# Patient Record
Sex: Female | Born: 1940 | Race: White | Hispanic: No | Marital: Married | Smoking: Never smoker
Health system: Southern US, Community
[De-identification: ages and names within clinical notes are randomized; demographics above are authoritative.]

## PROBLEM LIST (undated history)

## (undated) DIAGNOSIS — C50919 Malignant neoplasm of unspecified site of unspecified female breast: Secondary | ICD-10-CM

---

## 2017-01-26 ENCOUNTER — Inpatient Hospital Stay (HOSPITAL_COMMUNITY)
Admission: EM | Admit: 2017-01-26 | Discharge: 2017-02-24 | DRG: 082 | Disposition: E | Payer: Medicare Other | Attending: Internal Medicine | Admitting: Internal Medicine

## 2017-01-26 ENCOUNTER — Emergency Department (HOSPITAL_COMMUNITY): Payer: Medicare Other

## 2017-01-26 ENCOUNTER — Encounter (HOSPITAL_COMMUNITY): Payer: Self-pay | Admitting: Emergency Medicine

## 2017-01-26 DIAGNOSIS — Y92531 Health care provider office as the place of occurrence of the external cause: Secondary | ICD-10-CM | POA: Diagnosis not present

## 2017-01-26 DIAGNOSIS — J9601 Acute respiratory failure with hypoxia: Secondary | ICD-10-CM | POA: Diagnosis present

## 2017-01-26 DIAGNOSIS — R402113 Coma scale, eyes open, never, at hospital admission: Secondary | ICD-10-CM | POA: Diagnosis present

## 2017-01-26 DIAGNOSIS — W01198A Fall on same level from slipping, tripping and stumbling with subsequent striking against other object, initial encounter: Secondary | ICD-10-CM | POA: Diagnosis present

## 2017-01-26 DIAGNOSIS — Z9221 Personal history of antineoplastic chemotherapy: Secondary | ICD-10-CM | POA: Diagnosis not present

## 2017-01-26 DIAGNOSIS — R402313 Coma scale, best motor response, none, at hospital admission: Secondary | ICD-10-CM | POA: Diagnosis present

## 2017-01-26 DIAGNOSIS — S065XAA Traumatic subdural hemorrhage with loss of consciousness status unknown, initial encounter: Secondary | ICD-10-CM | POA: Diagnosis present

## 2017-01-26 DIAGNOSIS — R402213 Coma scale, best verbal response, none, at hospital admission: Secondary | ICD-10-CM | POA: Diagnosis present

## 2017-01-26 DIAGNOSIS — W19XXXA Unspecified fall, initial encounter: Secondary | ICD-10-CM | POA: Diagnosis present

## 2017-01-26 DIAGNOSIS — C50919 Malignant neoplasm of unspecified site of unspecified female breast: Secondary | ICD-10-CM | POA: Diagnosis present

## 2017-01-26 DIAGNOSIS — I6200 Nontraumatic subdural hemorrhage, unspecified: Secondary | ICD-10-CM

## 2017-01-26 DIAGNOSIS — Z515 Encounter for palliative care: Secondary | ICD-10-CM | POA: Diagnosis present

## 2017-01-26 DIAGNOSIS — Z66 Do not resuscitate: Secondary | ICD-10-CM | POA: Diagnosis present

## 2017-01-26 DIAGNOSIS — G935 Compression of brain: Secondary | ICD-10-CM | POA: Diagnosis present

## 2017-01-26 DIAGNOSIS — S065X9A Traumatic subdural hemorrhage with loss of consciousness of unspecified duration, initial encounter: Secondary | ICD-10-CM

## 2017-01-26 HISTORY — DX: Malignant neoplasm of unspecified site of unspecified female breast: C50.919

## 2017-01-26 MED ORDER — GLYCOPYRROLATE 0.2 MG/ML IJ SOLN: 0.2000 mg | INTRAMUSCULAR | Status: DC | PRN

## 2017-01-26 MED ORDER — GLYCOPYRROLATE 1 MG PO TABS
1.0000 mg | ORAL_TABLET | ORAL | Status: DC | PRN
  Filled 2017-01-26: qty 1

## 2017-01-26 MED ORDER — MORPHINE SULFATE (PF) 4 MG/ML IV SOLN: 1.0000 mg | INTRAVENOUS | Status: DC | PRN

## 2017-01-26 MED ORDER — LORAZEPAM 2 MG/ML IJ SOLN: 1.0000 mg | INTRAMUSCULAR | Status: DC | PRN

## 2017-01-26 MED ORDER — LORAZEPAM 1 MG PO TABS: 1.0000 mg | ORAL_TABLET | ORAL | Status: DC | PRN

## 2017-01-26 MED ORDER — ONDANSETRON HCL 4 MG/2ML IJ SOLN: 4.0000 mg | Freq: Four times a day (QID) | INTRAMUSCULAR | Status: DC | PRN

## 2017-01-26 MED ORDER — GLYCOPYRROLATE 0.2 MG/ML IJ SOLN
0.2000 mg | INTRAMUSCULAR | Status: DC | PRN
  Administered 2017-01-26: 0.2 mg via INTRAVENOUS
  Filled 2017-01-26 (×2): qty 1

## 2017-01-26 MED ORDER — ONDANSETRON 4 MG PO TBDP: 4.0000 mg | ORAL_TABLET | Freq: Four times a day (QID) | ORAL | Status: DC | PRN

## 2017-01-26 MED ORDER — LORAZEPAM 2 MG/ML IJ SOLN
1.0000 mg | INTRAMUSCULAR | Status: DC | PRN
Start: 2017-01-26 — End: 2017-01-27

## 2017-01-26 MED ORDER — PHENYLEPHRINE 40 MCG/ML (10ML) SYRINGE FOR IV PUSH (FOR BLOOD PRESSURE SUPPORT)
PREFILLED_SYRINGE | INTRAVENOUS | Status: AC
  Filled 2017-01-26: qty 10

## 2017-01-26 MED ORDER — LORAZEPAM 2 MG/ML PO CONC: 1.0000 mg | ORAL | Status: DC | PRN

## 2017-01-26 NOTE — ED Notes (Signed)
Pt transported to CT on cardiac monitor with RN

## 2017-01-26 NOTE — Progress Notes (Signed)
Patient admitted  Through the ED into 5M17. On arrival patient non verbal and had in situ non rebreather mask with o2 sats 100%. Patient DNR. Made comfortable in bed. Oriented and reassure  family member. Will keep monitoring.

## 2017-01-26 NOTE — ED Triage Notes (Signed)
Pt had syncopal episode while at home with husband.  He caught her and lowered her to ground.  On EMS arrival, pt unresponsive, cool, clammy, shallow respirations 12-14.  Pupils initially constricted at a 2 per EMS and upon arrival L pupil 3, and R pupil dilated.  PT remains unresponsive.  Pt being bagged on arrival.  EMS unsure of code status.  Reports Stage 4 breast CA.

## 2017-01-26 NOTE — ED Notes (Signed)
Delay in lab draw, edp just entered room and wants to talk to family in private.

## 2017-01-26 NOTE — ED Notes (Signed)
EDP and RT at bedside preparing to intubate.

## 2017-01-26 NOTE — ED Notes (Signed)
Per husband- pt does not wish to be resuscitated.  Husband to bedside to speak with Dr. Oleta Mouse.

## 2017-01-26 NOTE — H&P (Addendum)
History and Physical    Barbara Sherman XHB:[id number] DOB: 07-19-1940 DOA: 02/17/2017  Referring MD/NP/PA: Dr. Salvadore Dom PCP: No primary care provider on file.  Patient coming from: home via EMS  Chief Complaint: Unresponsive  HPI: Barbara Sherman is a 76 y.o. female with medical history significant of metastatic breast cancer; who presents after becoming unresponsive. History is obtained from the patient's husband who is present at bedside as the patient unable to give her own history at this time. Patient had been undergoing chemotherapy treatment for her cancer, but had become very weak lately. She had a follow-up appointment at term on helping [place] today at around 2 PM, and during the appointment lost her balance and fell hitting her head on the corner on an object in the room. There was reported to be no loss of consciousness and they observed her in the office for at least 45 minutes prior to letting her husband take her home. Once home the patient had gone to use the restroom. After some time the patient's husband called in to check on her and she said that she didn't feel well. As her husband tried to assist her from the bathroom she went unresponsive. He was able to catch her and lowered to the ground, but she was cool and clammy with shallow respirations. He immediately called EMS.  ED Course: Upon arrival to the emergency department patient was seen to have a temperature of 94.65F, pulse 31-127, respirations 19-32, blood pressure is maintained, O2 saturations as low as 72% on RA and patient was placed on a nonrebreather with improvement of O2 saturations. CT imaging revealed a large acute subdural hematoma. The patient's husband was updated on the imaging findings and requested comfort care measures only.  Review of Systems: Review of Systems  Unable to perform ROS: Patient unresponsive    Past Medical History:  Diagnosis Date  . Breast cancer ([place])     History reviewed. No  pertinent surgical history.   reports that she has never smoked. She has never used smokeless tobacco. She reports that she drinks alcohol. She reports that she does not use drugs.  Allergies:Tetracycline and other related products cause liver swelling  No known family history of any known problems of inpatient small  Prior to Admission medications   No known     Physical Exam:  Constitutional: Elderly female unresponsive with agonal breathing Vitals:   02/23/2017 2014 02/01/2017 2028 02/19/2017 2030 02/09/2017 2045  BP:  (!) 134/102 (!) 139/101 (!) 149/94  Pulse: (!) 127 (!) 31 (!) 122 (!) 119  Resp: (!) 29 (!) 32 (!) 29 (!) 30  Temp:      TempSrc:      SpO2: 98% 96% 98% 100%  Weight:       Eyes: Right pupil dilated, lids and conjunctivae normal ENMT: Mucous membranes are moist. Posterior pharynx clear of any exudate or lesions. Neck: normal, supple, no masses, no thyromegaly Respiratory: Tachypneic with very shallow respirations  Cardiovascular: Tachycardic no significant edema appreciated Abdomen: no tenderness, no masses palpated. No hepatosplenomegaly. Bowel sounds positive.  Musculoskeletal: no clubbing / cyanosis. No joint deformity upper and lower extremities. Good ROM, no contractures. Normal muscle tone.  Skin: Cool and clammy. no rashes, lesions, ulcers. No induration Neurologic: Unresponsive. Psychiatric: obtunded, and cannot assess    Labs on Admission: I have personally reviewed following labs and imaging studies  CBC: No results for input(s): WBC, NEUTROABS, HGB, HCT, MCV, PLT in the last 168 hours. Basic Metabolic  Panel: No results for input(s): NA, K, CL, CO2, GLUCOSE, BUN, CREATININE, CALCIUM, MG, PHOS in the last 168 hours. GFR: CrCl cannot be calculated (No order found.). Liver Function Tests: No results for input(s): AST, ALT, ALKPHOS, BILITOT, PROT, ALBUMIN in the last 168 hours. No results for input(s): LIPASE, AMYLASE in the last 168 hours. No results  for input(s): AMMONIA in the last 168 hours. Coagulation Profile: No results for input(s): INR, PROTIME in the last 168 hours. Cardiac Enzymes: No results for input(s): CKTOTAL, CKMB, CKMBINDEX, TROPONINI in the last 168 hours. BNP (last 3 results) No results for input(s): PROBNP in the last 8760 hours. HbA1C: No results for input(s): HGBA1C in the last 72 hours. CBG: No results for input(s): GLUCAP in the last 168 hours. Lipid Profile: No results for input(s): CHOL, HDL, LDLCALC, TRIG, CHOLHDL, LDLDIRECT in the last 72 hours. Thyroid Function Tests: No results for input(s): TSH, T4TOTAL, FREET4, T3FREE, THYROIDAB in the last 72 hours. Anemia Panel: No results for input(s): VITAMINB12, FOLATE, FERRITIN, TIBC, IRON, RETICCTPCT in the last 72 hours. Urine analysis: No results found for: COLORURINE, APPEARANCEUR, LABSPEC, PHURINE, GLUCOSEU, HGBUR, BILIRUBINUR, KETONESUR, PROTEINUR, UROBILINOGEN, NITRITE, LEUKOCYTESUR Sepsis Labs: No results found for this or any previous visit (from the past 240 hour(s)).   Radiological Exams on Admission: Ct Head Wo Contrast  Result Date: 01/30/2017 CLINICAL DATA:  76 y/o  F; syncopal episode. EXAM: CT HEAD WITHOUT CONTRAST TECHNIQUE: Contiguous axial images were obtained from the base of the skull through the vertex without intravenous contrast. COMPARISON:  None. FINDINGS: Brain: Acute subdural hematoma over the right cerebral convexity measuring up to 21 mm in thickness and left parietal convexity mixed attenuation subdural hematoma measuring up to 14 mm in thickness. Severe mass effect on the underlying brain with diffuse sulcal effacement, right lateral ventricle effacement common right to left midline shift of 13 mm, and effacement of the basilar cisterns with right-sided uncal herniation. No brain parenchymal hemorrhage or large stroke is identified at this time. Vascular: Calcific atherosclerosis of carotid siphons. Skull: Normal. Negative for fracture  or focal lesion. Sinuses/Orbits: No acute finding. Other: None. IMPRESSION: 1. Large acute subdural hematoma over the right cerebral convexity and additional smaller subdural hematoma over the left parietal lobe. 2. Severe mass effect on the underlying brain with effacement of right lateral and third ventricles, 13 mm right to left midline shift, right-sided uncal herniation, and near complete effacement of basilar cisterns. 3. No brain parenchymal hemorrhage or acute infarct identified at this time. Critical Value/emergent results were called by telephone at the time of interpretation on 02/03/2017 at 8:29 pm to Dr. Brantley Stage , who verbally acknowledged these results. Electronically Signed   By: Kristine Garbe M.D.   On: 02/02/2017 20:32   Dg Chest Portable 1 View  Result Date: 01/31/2017 CLINICAL DATA:  Unresponsive, syncopal episode. EXAM: PORTABLE CHEST 1 VIEW COMPARISON:  None. FINDINGS: Mild cardiomegaly. Atherosclerotic changes noted at the aortic arch. Coarse interstitial lung markings bilaterally without evidence of pneumonia or pulmonary edema. No pleural effusion or pneumothorax seen. Right chest wall Port-A-Cath in place with tip well-positioned at the level of the lower SVC/ cavoatrial junction. Sclerotic changes are seen throughout the visualized osseous structures suggesting osseous metastases. IMPRESSION: 1. No active cardiopulmonary abnormality seen. No evidence of pneumonia or pulmonary edema. 2. Osseous structures diffusely sclerotic suggesting osseous metastases. 3. Cardiomegaly. 4. Aortic atherosclerosis. Electronically Signed   By: Franki Cabot M.D.   On: 02/21/2017 20:30    EKG:  Independently reviewed. Sinus tachycardia with a BB  Assessment/Plan Comfort care measures only Subdural hematoma s/p Fall Uncal herniation Metastatic breast cancer  Given patient's poor prognostic outlook with known metastatic breast cancer and DO NOT RESUSCITATE status recommended comfort care  measures only due to significant subdural bleeding found on CT causing uncal herniation on admission likely related to recent fall. - Admit to a MedSurg bed - Apply Continuous O2 - Ativan as needed for seizure-like activity/anxiety - Morphine as needed for pain or dyspnea - Glycopyrrolate as needed for secretions - Zofran as needed for nausea or vomiting  DVT prophylaxis: none Code Status: DNR  Family Communication: Discussed all with care with the patient's husband who is at bedside. Disposition Plan: N/A Consults called: None Admission status: Inpatient   Norval Morton MD [facility] Pager [phone]   If 7PM-7AM, please contact night-coverage [email or web address] Password [email or web address]  02/02/2017, 9:21 PM

## 2017-01-26 NOTE — ED Notes (Addendum)
Report given to Lydia Guiles, Therapist, sports. Pt is COMFORT CARE.

## 2017-01-26 NOTE — ED Provider Notes (Addendum)
[facility] DEPT Provider Note   CSN: [id number] Arrival date & time: 01/24/2017  1943     History   Chief Complaint Chief Complaint  Patient presents with  . unresponsive    HPI Barbara Sherman is a 76 y.o. female.  HPI Level 5 caveat due to unresponsiveness.  76 year old female with history of metastatic breast cancer who presents with episode of unresponsiveness. According to EMS around 1900 patient had a syncopal episode and since then has been unresponsive. She is agonally breathing.   Past Medical History:  Diagnosis Date  . Breast cancer ([place])     There are no active problems to display for this patient.   History reviewed. No pertinent surgical history.  OB History    No data available       Home Medications    Prior to Admission medications   Not on File    Family History No family history on file.  Social History Social History  Substance Use Topics  . Smoking status: Never Smoker  . Smokeless tobacco: Never Used  . Alcohol use Yes     Allergies   Patient has no allergy information on record.   Review of Systems Review of Systems  Unable to perform ROS: Patient unresponsive     Physical Exam Updated Vital Signs BP (!) 139/101   Pulse (!) 122   Resp (!) 29   Wt 77.1 kg (170 lb)   SpO2 98%   Physical Exam  Constitutional: She appears distressed.  Acutely ill-appearing,  HENT:  Head: Normocephalic.  Eyes:  Right pupil dilated to 5 mm, left pupil 3 mm  Neck: Neck supple.  Cardiovascular: Regular rhythm.   Heart tachycardic rate  Pulmonary/Chest: She is in respiratory distress.  Abdominal: Soft. She exhibits no distension.  Musculoskeletal: She exhibits no deformity.  Neurological:  Unresponsive to voice or tactile stimuli  Skin:  Cool to touch. Dry     ED Treatments / Results  Labs (all labs ordered are listed, but only abnormal results are displayed) Labs Reviewed - No data to display  EKG  EKG  Interpretation None       Radiology Ct Head Wo Contrast  Result Date: 01/31/2017 CLINICAL DATA:  76 y/o  F; syncopal episode. EXAM: CT HEAD WITHOUT CONTRAST TECHNIQUE: Contiguous axial images were obtained from the base of the skull through the vertex without intravenous contrast. COMPARISON:  None. FINDINGS: Brain: Acute subdural hematoma over the right cerebral convexity measuring up to 21 mm in thickness and left parietal convexity mixed attenuation subdural hematoma measuring up to 14 mm in thickness. Severe mass effect on the underlying brain with diffuse sulcal effacement, right lateral ventricle effacement common right to left midline shift of 13 mm, and effacement of the basilar cisterns with right-sided uncal herniation. No brain parenchymal hemorrhage or large stroke is identified at this time. Vascular: Calcific atherosclerosis of carotid siphons. Skull: Normal. Negative for fracture or focal lesion. Sinuses/Orbits: No acute finding. Other: None. IMPRESSION: 1. Large acute subdural hematoma over the right cerebral convexity and additional smaller subdural hematoma over the left parietal lobe. 2. Severe mass effect on the underlying brain with effacement of right lateral and third ventricles, 13 mm right to left midline shift, right-sided uncal herniation, and near complete effacement of basilar cisterns. 3. No brain parenchymal hemorrhage or acute infarct identified at this time. Critical Value/emergent results were called by telephone at the time of interpretation on 02/07/2017 at 8:29 pm to Dr. Brantley Stage , who  verbally acknowledged these results. Electronically Signed   By: Kristine Garbe M.D.   On: 02/12/2017 20:32   Dg Chest Portable 1 View  Result Date: 02/05/2017 CLINICAL DATA:  Unresponsive, syncopal episode. EXAM: PORTABLE CHEST 1 VIEW COMPARISON:  None. FINDINGS: Mild cardiomegaly. Atherosclerotic changes noted at the aortic arch. Coarse interstitial lung markings bilaterally  without evidence of pneumonia or pulmonary edema. No pleural effusion or pneumothorax seen. Right chest wall Port-A-Cath in place with tip well-positioned at the level of the lower SVC/ cavoatrial junction. Sclerotic changes are seen throughout the visualized osseous structures suggesting osseous metastases. IMPRESSION: 1. No active cardiopulmonary abnormality seen. No evidence of pneumonia or pulmonary edema. 2. Osseous structures diffusely sclerotic suggesting osseous metastases. 3. Cardiomegaly. 4. Aortic atherosclerosis. Electronically Signed   By: Franki Cabot M.D.   On: 02/06/2017 20:30    Procedures Procedures (including critical care time) CRITICAL CARE Performed by: Forde Dandy   Total critical care time: 35 minutes  Critical care time was exclusive of separately billable procedures and treating other patients.  Critical care was necessary to treat or prevent imminent or life-threatening deterioration.  Critical care was time spent personally by me on the following activities: development of treatment plan with patient and/or surrogate as well as nursing, evaluation of patient's response to treatment, examination of patient, obtaining history from patient or surrogate, ordering and performing treatments and interventions, ordering and review of laboratory studies, ordering and review of radiographic studies, pulse oximetry and re-evaluation of patient's condition.  Medications Ordered in ED Medications - No data to display   Initial Impression / Assessment and Plan / ED Course  I have reviewed the triage vital signs and the nursing notes.  Pertinent labs & imaging results that were available during my care of the patient were reviewed by me and considered in my medical decision making (see chart for details).     Patient arrives unresponsive, agonal breath sounds, hypoxic with nonrebreather. BVM assisted breaths given at bedside. Plan for intubation, however patient's husband  arrived. I spoke with him and he does confirm that she is DO NOT RESUSCITATE. Concern for intracranial process given that she had possible posturing and her unresponsiveness with unequal pupils. CT head does confirm bilateral subdural hematomas worse on the right. She also has evidence of early uncal herniation and mass effect. I spoke with patient's husband. Given her advanced age, advanced breast cancer, and poor prognosis he does wish to make her comfort care. Plan to admit for palliative care consult and comfort care.   Final Clinical Impressions(s) / ED Diagnoses   Final diagnoses:  Subdural hematoma ([place])  Uncal herniation [facility])    New Prescriptions New Prescriptions   No medications on file     Forde Dandy, MD 02/19/2017 2045    Forde Dandy, MD 02/22/17 [phone]

## 2017-02-24 NOTE — Progress Notes (Addendum)
Patient was seen to have  low pulse and 02 sats by the nurse tech during vital signs checking at 0540. RN was called in to see patient. On arrival patient was seen to giving the last breathe. RN checked the pulse and saw there was no pulse and patient has ceased breathing at exactly 0545. The charged nurse was called to confirm to pronounce her dead. Attending MD was notified. Significant other at the bedside. Reassured and consoled.  Required forms have been filled and all appropriate calls have also been made. Patients's body prepared and sent down to the morgue.

## 2017-02-24 NOTE — Death Summary Note (Signed)
Barbara Sherman OZD:[id number] DOB: Nov 20, 1940 DOA: February 20, 2017  PCP: Verdell Carmine, MD PCP/Office notified: Will be notified by family  Admit date: February 20, 2017 Date of Death: 02/21/2017 at 5:45 AM  Final Diagnoses:  Active Problems:   Subdural hematoma ([facility])   Comfort measures only status   Uncal herniation   Fall      History of present illness:  Barbara Sherman is a 76 y.o. female with medical history significant of metastatic breast cancer on chemotherapy; who presents after becoming unresponsive. History is obtained from the patient's husband who is present at bedside as the patient unable to give her own history at this time. Patient had been undergoing chemotherapy treatment for her cancer, but had become very weak lately. She had a follow-up appointment at term on helping [place] today at around 2 PM, and during the appointment lost her balance and fell hitting her head on the corner on an object in the room. There was reported to be no loss of consciousness and they observed her in the office for at least 45 minutes prior to letting her husband take her home. Once home the patient had gone to use the restroom. After some time the patient's husband called in to check on her and she said that she didn't feel well. As her husband tried to assist her from the bathroom she went unresponsive. He was able to catch her and lowered to the ground, but she was cool and clammy with shallow respirations. He immediately called EMS.  Hospital Course:  Upon arrival to the emergency department patient was seen to have a temperature of 94.62F, pulse 31-127, respirations 19-32, blood pressure is maintained, O2 saturations as low as 72% on RA and patient was placed on a nonrebreather with improvement of O2 saturations. CT imaging revealed a large acute subdural hematoma with signs of focal herniation. The patient's husband was updated on the imaging findings and requested comfort care measures only. Patient was  moved to a MedSurg bed. RN pronounced time of death at 5:45 AM on February 21, 2017.     Signed:  Norval Morton  [facility] 2017/02/21, 6:50 AM

## 2017-02-24 DEATH — deceased

## 2018-10-06 IMAGING — DX DG CHEST 1V PORT
1 series · 1 of 1 positions shown · non-contrast
Comparison: None.

CLINICAL DATA: Unresponsive, syncopal episode.

EXAM:
PORTABLE CHEST 1 VIEW

[chest]
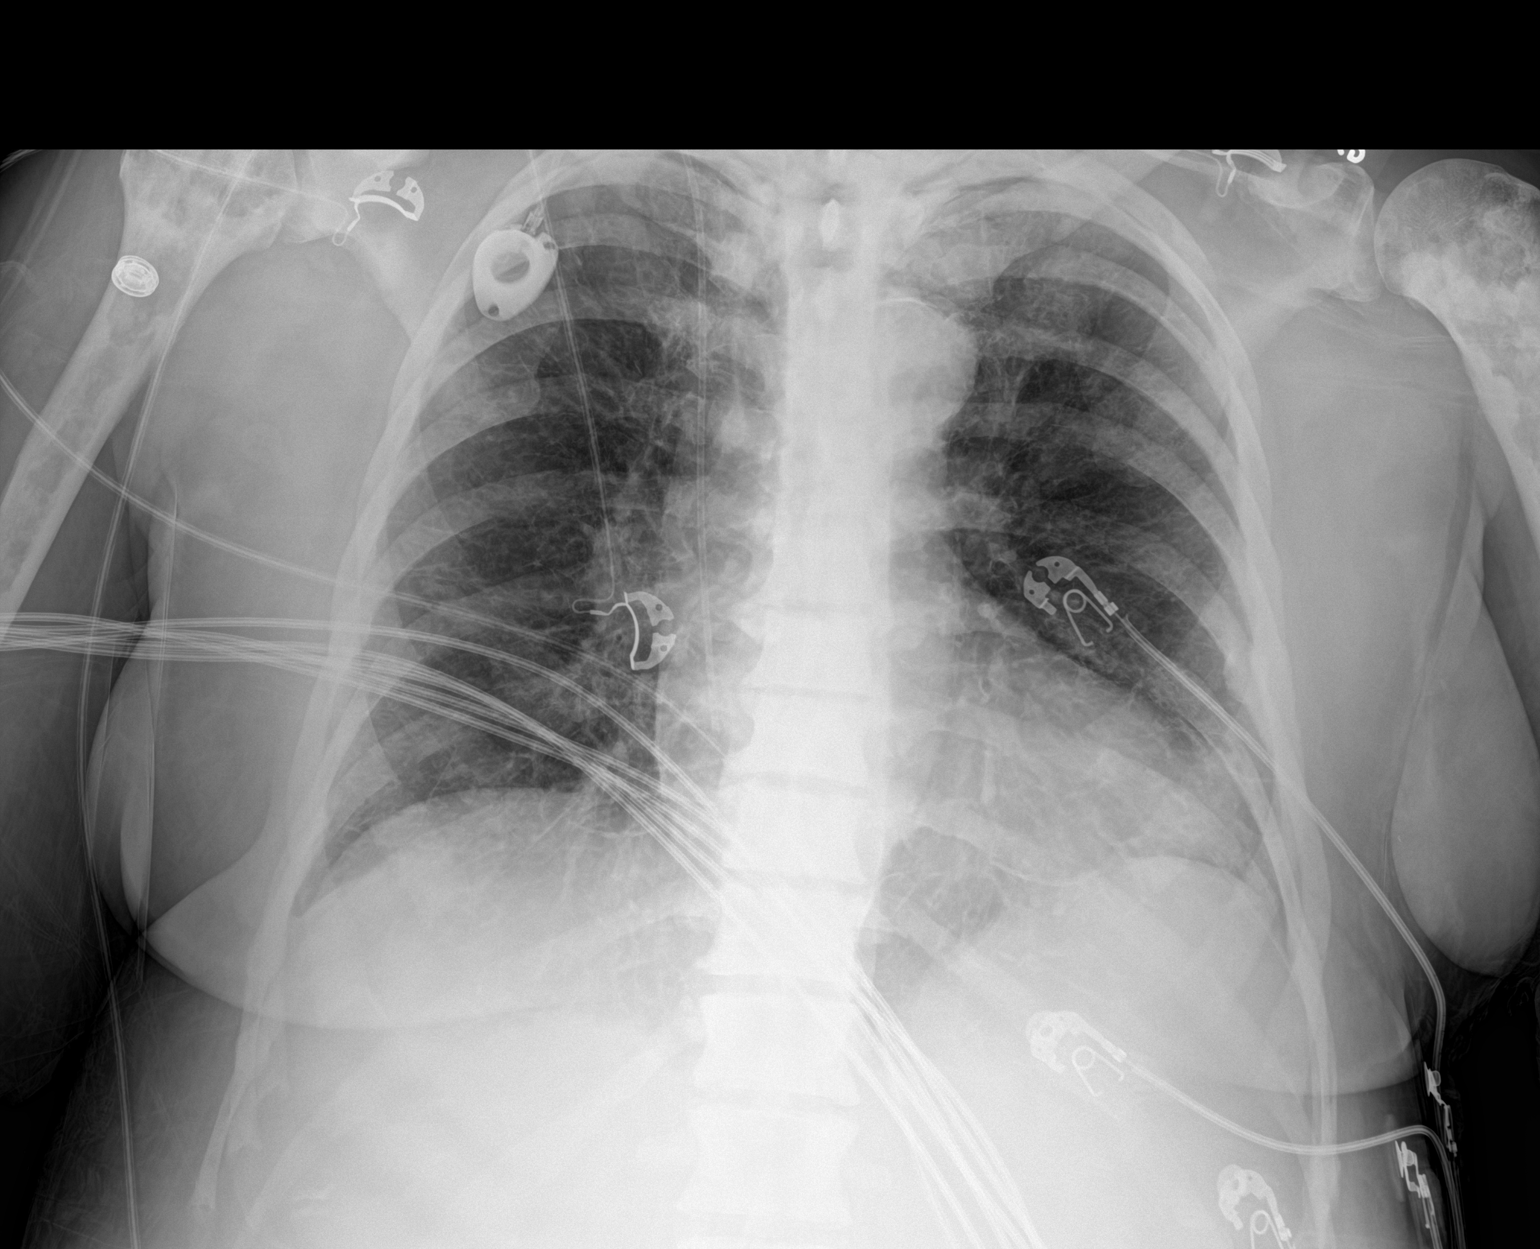

[1 of 1 positions shown; findings below may reference images not displayed]

FINDINGS: Mild cardiomegaly. Atherosclerotic changes noted at the aortic arch.
Coarse interstitial lung markings bilaterally without evidence of
pneumonia or pulmonary edema. No pleural effusion or pneumothorax
seen. Right chest wall Port-A-Cath in place with tip well-positioned
at the level of the lower SVC/ cavoatrial junction.

Sclerotic changes are seen throughout the visualized osseous
structures suggesting osseous metastases.
IMPRESSION: 1. No active cardiopulmonary abnormality seen. No evidence of
pneumonia or pulmonary edema.
2. Osseous structures diffusely sclerotic suggesting osseous
metastases.
3. Cardiomegaly.
4. Aortic atherosclerosis.

## 2018-10-06 IMAGING — CT CT HEAD W/O CM
4 series · 15 of 47 positions shown, 17 images · non-contrast
Comparison: None.

CLINICAL DATA: 75 y/o  F; syncopal episode.

EXAM:
CT HEAD WITHOUT CONTRAST
TECHNIQUE: Contiguous axial images were obtained from the base of the skull
through the vertex without intravenous contrast.

[Series 3: head without · axial · non-contrast · 0.49mm/px · z∈[-178,-58]mm · 7 of 34 slices shown, 9 images]
[im 5/34  brain]
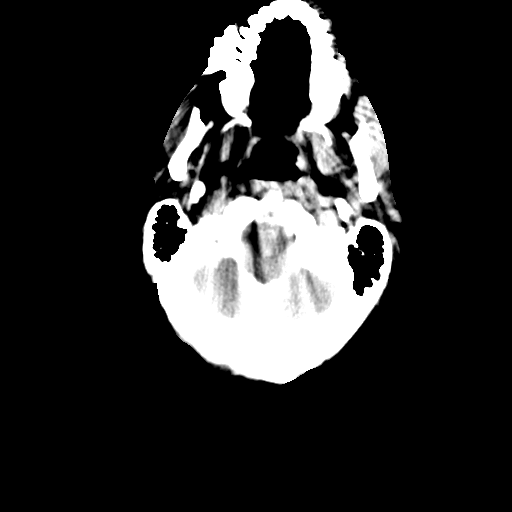
[im 5/34  bone]
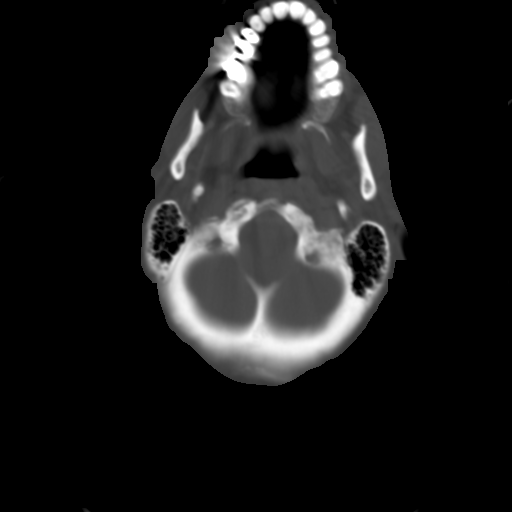
[im 9/34  brain]
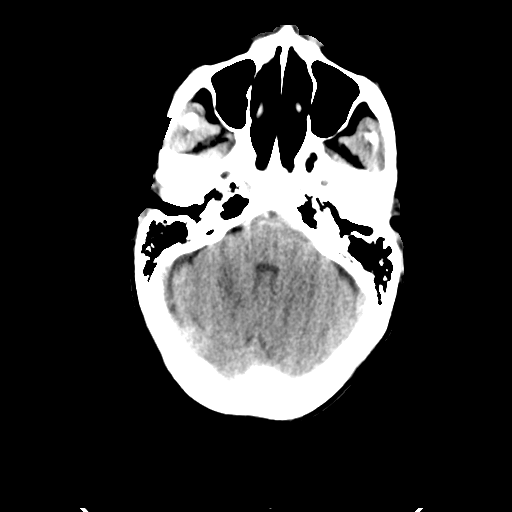
[im 13/34  brain]
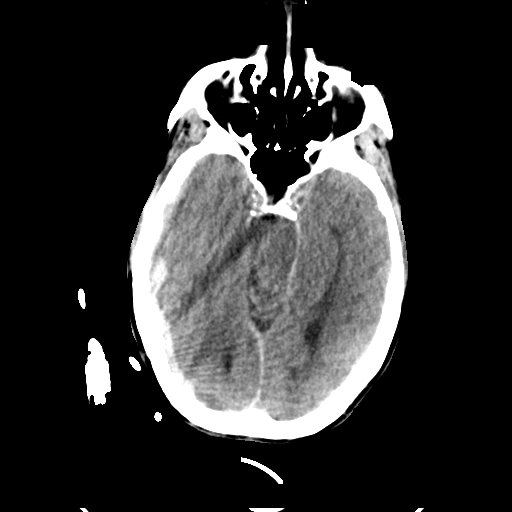
[im 17/34  brain]
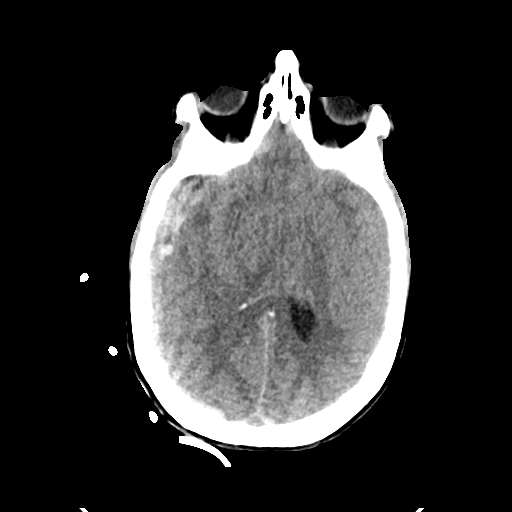
[im 21/34  brain]
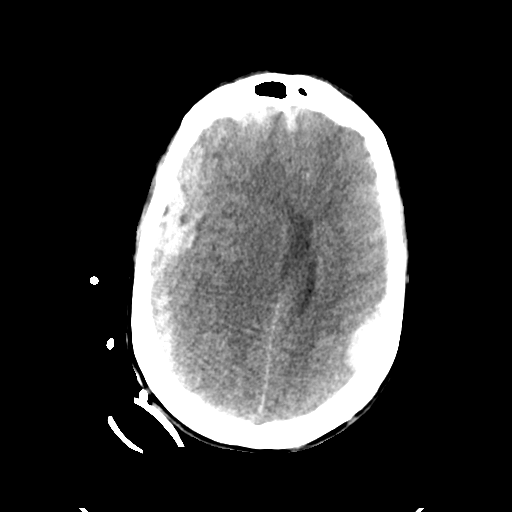
[im 21/34  bone]
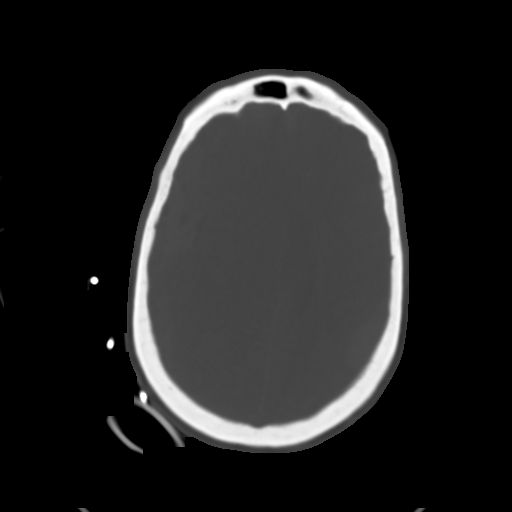
[im 25/34  brain]
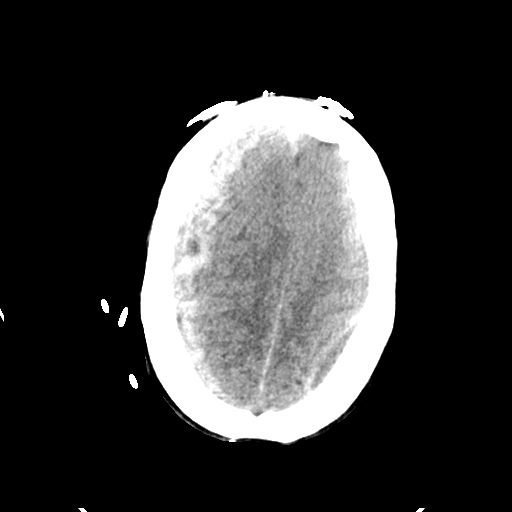
[im 29/34  brain]
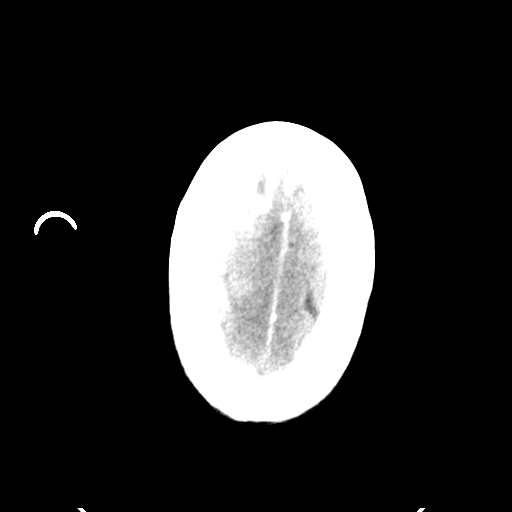

[Series 4: head bone · axial · 0.49mm/px · z∈[-182,-166]mm · 2 of 85 slices shown]
[im 9/85  bone]
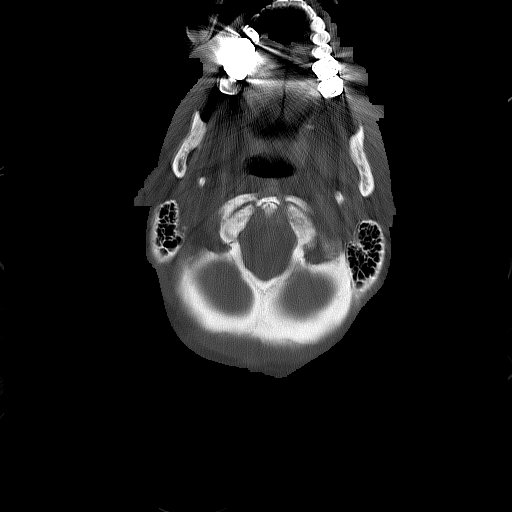
[im 17/85  bone]
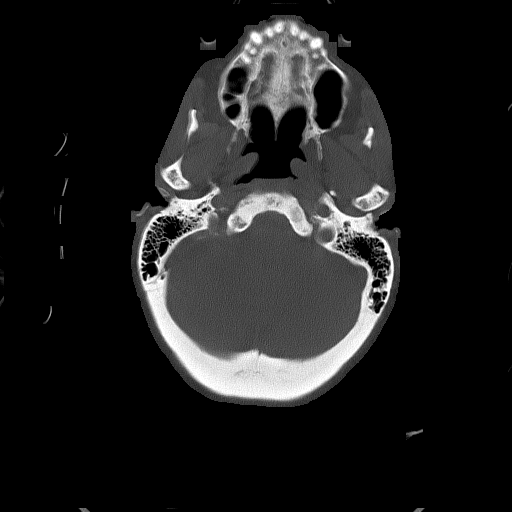

[Series 5: head without cor · coronal · non-contrast · 0.33mm/px · 3 of 77 slices shown]
[im 26/77  brain]
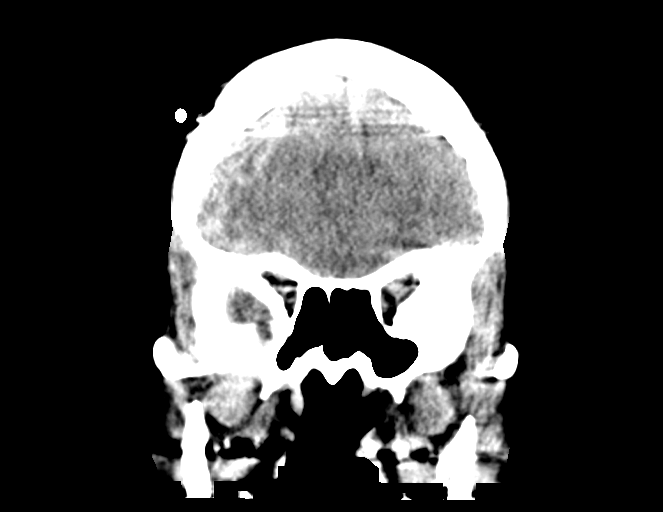
[im 34/77  brain]
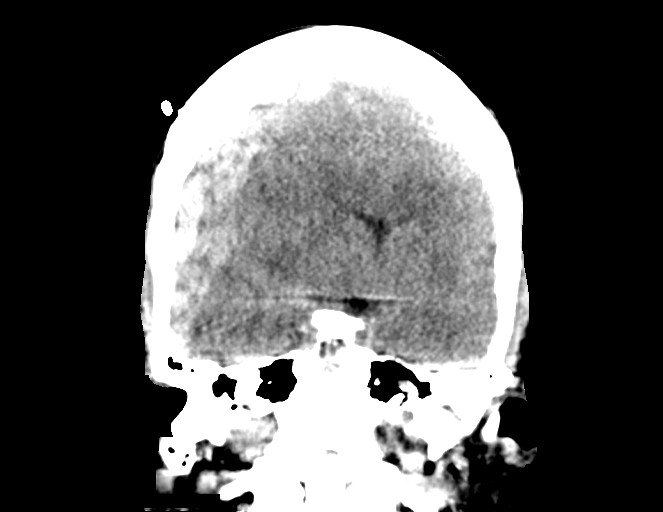
[im 43/77  brain]
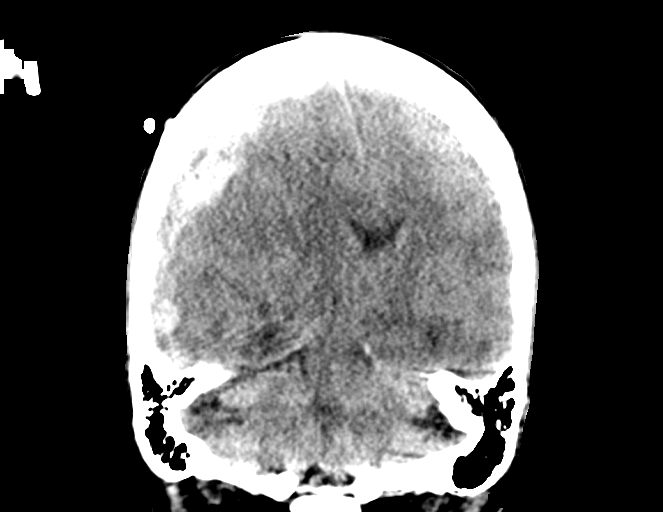

[Series 6: head without sag · sagittal · non-contrast · 0.40mm/px · 3 of 66 slices shown]
[im 22/66  brain]
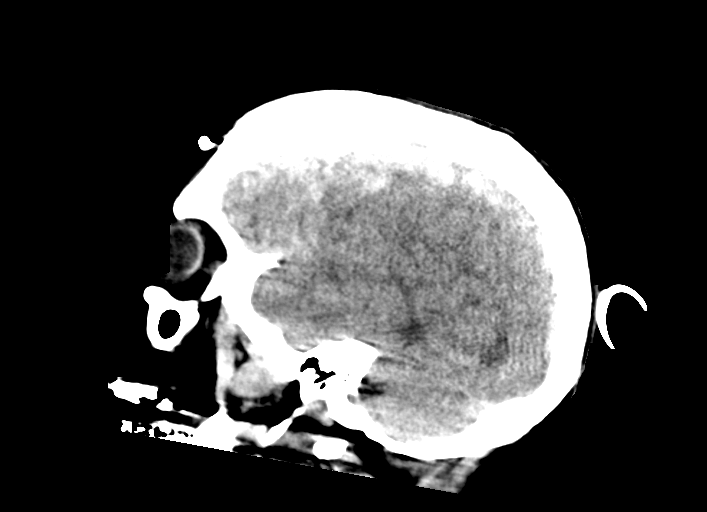
[im 33/66  brain]
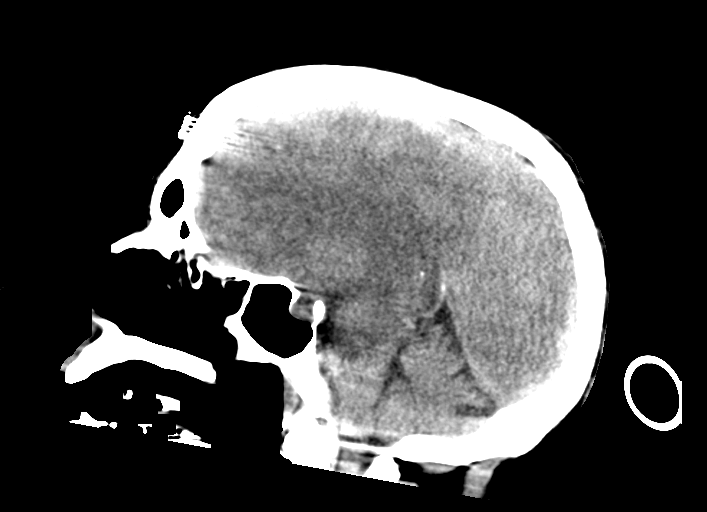
[im 44/66  brain]
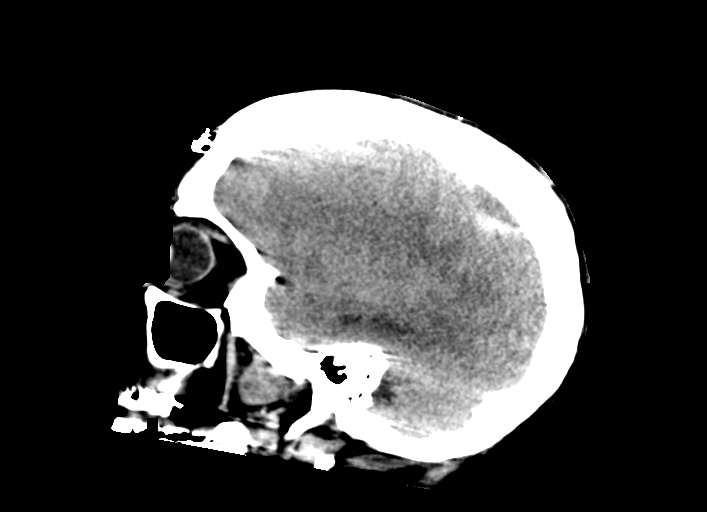

[15 of 47 positions shown; findings below may reference images not displayed]

FINDINGS: Brain: Acute subdural hematoma over the right cerebral convexity
measuring up to 21 mm in thickness and left parietal convexity mixed
attenuation subdural hematoma measuring up to 14 mm in thickness.
Severe mass effect on the underlying brain with diffuse sulcal
effacement, right lateral ventricle effacement common right to left
midline shift of 13 mm, and effacement of the basilar cisterns with
right-sided uncal herniation. No brain parenchymal hemorrhage or
large stroke is identified at this time.

Vascular: Calcific atherosclerosis of carotid siphons.

Skull: Normal. Negative for fracture or focal lesion.

Sinuses/Orbits: No acute finding.

Other: None.
IMPRESSION: 1. Large acute subdural hematoma over the right cerebral convexity
and additional smaller subdural hematoma over the left parietal
lobe.
2. Severe mass effect on the underlying brain with effacement of
right lateral and third ventricles, 13 mm right to left midline
shift, right-sided uncal herniation, and near complete effacement of
basilar cisterns.
3. No brain parenchymal hemorrhage or acute infarct identified at
this time.
Critical Value/emergent results were called by telephone at the time
of interpretation on 01/26/2017 at [DATE] to Dr. JEPHTE LE JOURNALISTE ELAN THELEMAQUE , who
verbally acknowledged these results.

By: Jou Jou Janna M.D.
